# Patient Record
Sex: Female | Born: 1985 | Race: Black or African American | Hispanic: No | Marital: Single | Smoking: Never smoker
Health system: Southern US, Community
[De-identification: ages and names within clinical notes are randomized; demographics above are authoritative.]

---

## 2014-01-22 ENCOUNTER — Encounter (HOSPITAL_COMMUNITY): Payer: Self-pay | Admitting: Emergency Medicine

## 2014-01-22 DIAGNOSIS — K5900 Constipation, unspecified: Secondary | ICD-10-CM | POA: Insufficient documentation

## 2014-01-22 DIAGNOSIS — Z792 Long term (current) use of antibiotics: Secondary | ICD-10-CM | POA: Insufficient documentation

## 2014-01-22 DIAGNOSIS — R109 Unspecified abdominal pain: Secondary | ICD-10-CM | POA: Insufficient documentation

## 2014-01-22 DIAGNOSIS — Z3202 Encounter for pregnancy test, result negative: Secondary | ICD-10-CM | POA: Insufficient documentation

## 2014-01-22 LAB — CBC WITH DIFFERENTIAL/PLATELET
BASOS PCT: 0 % (ref 0–1)
Basophils Absolute: 0 10*3/uL (ref 0.0–0.1)
EOS PCT: 4 % (ref 0–5)
Eosinophils Absolute: 0.1 10*3/uL (ref 0.0–0.7)
HCT: 30.9 % — ABNORMAL LOW (ref 36.0–46.0)
HEMOGLOBIN: 10.1 g/dL — AB (ref 12.0–15.0)
Lymphocytes Relative: 53 % — ABNORMAL HIGH (ref 12–46)
Lymphs Abs: 1.7 10*3/uL (ref 0.7–4.0)
MCH: 28.1 pg (ref 26.0–34.0)
MCHC: 32.7 g/dL (ref 30.0–36.0)
MCV: 86.1 fL (ref 78.0–100.0)
Monocytes Absolute: 0.4 10*3/uL (ref 0.1–1.0)
Monocytes Relative: 13 % — ABNORMAL HIGH (ref 3–12)
NEUTROS PCT: 30 % — AB (ref 43–77)
Neutro Abs: 1 10*3/uL — ABNORMAL LOW (ref 1.7–7.7)
PLATELETS: 227 10*3/uL (ref 150–400)
RBC: 3.59 MIL/uL — AB (ref 3.87–5.11)
RDW: 13.9 % (ref 11.5–15.5)
WBC: 3.2 10*3/uL — ABNORMAL LOW (ref 4.0–10.5)

## 2014-01-22 LAB — COMPREHENSIVE METABOLIC PANEL
ALK PHOS: 61 U/L (ref 39–117)
ALT: 10 U/L (ref 0–35)
AST: 21 U/L (ref 0–37)
Albumin: 3.9 g/dL (ref 3.5–5.2)
BUN: 12 mg/dL (ref 6–23)
CO2: 24 meq/L (ref 19–32)
Calcium: 8.9 mg/dL (ref 8.4–10.5)
Chloride: 98 mEq/L (ref 96–112)
Creatinine, Ser: 0.65 mg/dL (ref 0.50–1.10)
GFR calc non Af Amer: >90 mL/min (ref >90)
GLUCOSE: 81 mg/dL (ref 70–99)
Potassium: 3.8 mEq/L (ref 3.7–5.3)
SODIUM: 137 meq/L (ref 137–147)
TOTAL PROTEIN: 7.4 g/dL (ref 6.0–8.3)
Total Bilirubin: 0.2 mg/dL — ABNORMAL LOW (ref 0.3–1.2)

## 2014-01-22 NOTE — ED Notes (Signed)
Patient unable to provide urine at this time.  Will continue to monitor.

## 2014-01-22 NOTE — ED Notes (Signed)
Pt. reports low back pain radiating to vagina / anal area , denies injury or fall , no urinary discomfort or discharge . Denies fever , occasional chills.

## 2014-01-23 ENCOUNTER — Emergency Department (HOSPITAL_COMMUNITY)
Admission: EM | Admit: 2014-01-23 | Discharge: 2014-01-23 | Disposition: A | Discharge status: Home / Self Care | Payer: No Typology Code available for payment source | Attending: Emergency Medicine | Admitting: Emergency Medicine

## 2014-01-23 ENCOUNTER — Emergency Department (HOSPITAL_COMMUNITY): Payer: No Typology Code available for payment source

## 2014-01-23 DIAGNOSIS — R109 Unspecified abdominal pain: Secondary | ICD-10-CM

## 2014-01-23 DIAGNOSIS — K5900 Constipation, unspecified: Secondary | ICD-10-CM

## 2014-01-23 LAB — WET PREP, GENITAL
Trich, Wet Prep: NONE SEEN
YEAST WET PREP: NONE SEEN

## 2014-01-23 LAB — URINALYSIS, ROUTINE W REFLEX MICROSCOPIC
Bilirubin Urine: NEGATIVE
Glucose, UA: NEGATIVE mg/dL
Hgb urine dipstick: NEGATIVE
Ketones, ur: >80 mg/dL — AB
LEUKOCYTES UA: NEGATIVE
Nitrite: NEGATIVE
PH: 6 (ref 5.0–8.0)
Protein, ur: 30 mg/dL — AB
Specific Gravity, Urine: 1.032 — ABNORMAL HIGH (ref 1.005–1.030)
Urobilinogen, UA: 0.2 mg/dL (ref 0.0–1.0)

## 2014-01-23 LAB — URINE MICROSCOPIC-ADD ON

## 2014-01-23 LAB — POC URINE PREG, ED: PREG TEST UR: NEGATIVE

## 2014-01-23 MED ORDER — DICYCLOMINE HCL 20 MG PO TABS: 20.0000 mg | ORAL_TABLET | Freq: Two times a day (BID) | ORAL | Status: AC

## 2014-01-23 MED ORDER — KETOROLAC TROMETHAMINE 30 MG/ML IJ SOLN
30.0000 mg | Freq: Once | INTRAMUSCULAR | Status: AC
  Administered 2014-01-23: 30 mg via INTRAVENOUS
  Filled 2014-01-23: qty 1

## 2014-01-23 MED ORDER — IBUPROFEN 600 MG PO TABS: 600.0000 mg | ORAL_TABLET | Freq: Four times a day (QID) | ORAL | Status: AC | PRN

## 2014-01-23 MED ORDER — POLYETHYLENE GLYCOL 3350 17 G PO PACK: 17.0000 g | PACK | Freq: Every day | ORAL | Status: AC

## 2014-01-23 MED ORDER — SODIUM CHLORIDE 0.9 % IV BOLUS (SEPSIS)
1000.0000 mL | Freq: Once | INTRAVENOUS | Status: AC
  Administered 2014-01-23: 1000 mL via INTRAVENOUS

## 2014-01-23 NOTE — Discharge Instructions (Signed)
If your symptoms continue call and followup in the OB/GYN clinic. Return immediately for worsening pain, persistent vomiting, fever or for any concerns.  Abdominal Pain, Women Abdominal (stomach, pelvic, or belly) pain can be caused by many things. It is important to tell your doctor:  The location of the pain.  Does it come and go or is it present all the time?  Are there things that start the pain (eating certain foods, exercise)?  Are there other symptoms associated with the pain (fever, nausea, vomiting, diarrhea)? All of this is helpful to know when trying to find the cause of the pain. CAUSES   Stomach: virus or bacteria infection, or ulcer.  Intestine: appendicitis (inflamed appendix), regional ileitis (Crohn's disease), ulcerative colitis (inflamed colon), irritable bowel syndrome, diverticulitis (inflamed diverticulum of the colon), or cancer of the stomach or intestine.  Gallbladder disease or stones in the gallbladder.  Kidney disease, kidney stones, or infection.  Pancreas infection or cancer.  Fibromyalgia (pain disorder).  Diseases of the female organs:  Uterus: fibroid (non-cancerous) tumors or infection.  Fallopian tubes: infection or tubal pregnancy.  Ovary: cysts or tumors.  Pelvic adhesions (scar tissue).  Endometriosis (uterus lining tissue growing in the pelvis and on the pelvic organs).  Pelvic congestion syndrome (female organs filling up with blood just before the menstrual period).  Pain with the menstrual period.  Pain with ovulation (producing an egg).  Pain with an IUD (intrauterine device, birth control) in the uterus.  Cancer of the female organs.  Functional pain (pain not caused by a disease, may improve without treatment).  Psychological pain.  Depression. DIAGNOSIS  Your doctor will decide the seriousness of your pain by doing an examination.  Blood tests.  X-rays.  Ultrasound.  CT scan (computed tomography, special type  of X-ray).  MRI (magnetic resonance imaging).  Cultures, for infection.  Barium enema (dye inserted in the large intestine, to better view it with X-rays).  Colonoscopy (looking in intestine with a lighted tube).  Laparoscopy (minor surgery, looking in abdomen with a lighted tube).  Major abdominal exploratory surgery (looking in abdomen with a large incision). TREATMENT  The treatment will depend on the cause of the pain.   Many cases can be observed and treated at home.  Over-the-counter medicines recommended by your caregiver.  Prescription medicine.  Antibiotics, for infection.  Birth control pills, for painful periods or for ovulation pain.  Hormone treatment, for endometriosis.  Nerve blocking injections.  Physical therapy.  Antidepressants.  Counseling with a psychologist or psychiatrist.  Minor or major surgery. HOME CARE INSTRUCTIONS   Do not take laxatives, unless directed by your caregiver.  Take over-the-counter pain medicine only if ordered by your caregiver. Do not take aspirin because it can cause an upset stomach or bleeding.  Try a clear liquid diet (broth or water) as ordered by your caregiver. Slowly move to a bland diet, as tolerated, if the pain is related to the stomach or intestine.  Have a thermometer and take your temperature several times a day, and record it.  Bed rest and sleep, if it helps the pain.  Avoid sexual intercourse, if it causes pain.  Avoid stressful situations.  Keep your follow-up appointments and tests, as your caregiver orders.  If the pain does not go away with medicine or surgery, you may try:  Acupuncture.  Relaxation exercises (yoga, meditation).  Group therapy.  Counseling. SEEK MEDICAL CARE IF:   You notice certain foods cause stomach pain.  Your home  care treatment is not helping your pain.  You need stronger pain medicine.  You want your IUD removed.  You feel faint or lightheaded.  You  develop nausea and vomiting.  You develop a rash.  You are having side effects or an allergy to your medicine. SEEK IMMEDIATE MEDICAL CARE IF:   Your pain does not go away or gets worse.  You have a fever.  Your pain is felt only in portions of the abdomen. The right side could possibly be appendicitis. The left lower portion of the abdomen could be colitis or diverticulitis.  You are passing blood in your stools (bright red or black tarry stools, with or without vomiting).  You have blood in your urine.  You develop chills, with or without a fever.  You pass out. MAKE SURE YOU:   Understand these instructions.  Will watch your condition.  Will get help right away if you are not doing well or get worse. Document Released: 08/23/2007 Document Revised: 01/18/2012 Document Reviewed: 09/12/2009 [facility] Patient Information 2014 [employer], [place].

## 2014-01-23 NOTE — ED Notes (Signed)
Family at bedside. 

## 2014-01-23 NOTE — ED Provider Notes (Signed)
CSN: [id number]     Arrival date & time 01/22/14  2040 History   First MD Initiated Contact with Patient 01/23/14 [phone]     Chief Complaint  Patient presents with  . Back Pain     (Consider location/radiation/quality/duration/timing/severity/associated sxs/prior Treatment) HPI Patient presents with 2 days of lower bowel pain that is spasming in nature. She was recently started on Cipro for urinary tract infection with no improvement of her symptoms. She denies dysuria, urinary frequency, urgency or hematuria. She's had no fevers or chills. She denies any back or flank pain. She's had no vaginal discharge or bleeding. She states that the day before her symptoms started she had several loose stools but since has not passed stool. Patient states she is not sexually active. History reviewed. No pertinent past medical history. History reviewed. No pertinent past surgical history. No family history on file. History  Substance Use Topics  . Smoking status: Never Smoker   . Smokeless tobacco: Not on file  . Alcohol Use: No   OB History   Grav Para Term Preterm Abortions TAB SAB Ect Mult Living                 Review of Systems  Constitutional: Negative for fever and chills.  Respiratory: Negative for shortness of breath.   Cardiovascular: Negative for chest pain.  Gastrointestinal: Positive for abdominal pain and constipation. Negative for nausea, vomiting and blood in stool.  Genitourinary: Negative for dysuria, hematuria, flank pain, vaginal bleeding and pelvic pain.  Musculoskeletal: Negative for back pain, myalgias, neck pain and neck stiffness.  Skin: Negative for rash and wound.  Neurological: Negative for dizziness, weakness, light-headedness, numbness and headaches.  All other systems reviewed and are negative.      Allergies  Review of patient's allergies indicates no known allergies.  Home Medications   Current Outpatient Rx  Name  Route  Sig  Dispense  Refill  .  ciprofloxacin (CIPRO) 500 MG tablet   Oral   Take 500 mg by mouth 2 (two) times daily. For 10 days, started on 01-22-14 on day 1 of therapy          BP 123/83  Pulse 64  Temp(Src) 98.4 F (36.9 C) (Oral)  Resp 17  Ht 5\' 3"  (1.6 m)  Wt 138 lb (62.596 kg)  BMI 24.45 kg/m2  SpO2 99%  LMP 01/16/2014 Physical Exam  Nursing note and vitals reviewed. Constitutional: She is oriented to person, place, and time. She appears well-developed and well-nourished. No distress.  HENT:  Head: Normocephalic and atraumatic.  Mouth/Throat: Oropharynx is clear and moist.  Eyes: EOM are normal. Pupils are equal, round, and reactive to light.  Neck: Normal range of motion. Neck supple.  Cardiovascular: Normal rate and regular rhythm.   Pulmonary/Chest: Effort normal and breath sounds normal. No respiratory distress. She has no wheezes. She has no rales. She exhibits no tenderness.  Abdominal: Soft. Bowel sounds are normal. She exhibits no distension and no mass. There is tenderness (tenderness to palpation over the suprapubic region. There is no rebound or guarding.). There is no rebound and no guarding.  Genitourinary:  Painful pelvic exam. Hymen still partially intact. Unable to visualize cervix. Mild yellow discharge.  Musculoskeletal: Normal range of motion. She exhibits no edema and no tenderness.  No midline thoracic or lumbar tenderness to palpation. Patient has no CVA tenderness bilaterally.  Neurological: She is alert and oriented to person, place, and time.  Moves all extremities without deficit. Sensation  grossly intact.  Skin: Skin is warm and dry. No rash noted. No erythema.  Psychiatric: She has a normal mood and affect. Her behavior is normal.    ED Course  Procedures (including critical care time) Labs Review Labs Reviewed  URINALYSIS, ROUTINE W REFLEX MICROSCOPIC - Abnormal; Notable for the following:    APPearance CLOUDY (*)    Specific Gravity, Urine 1.032 (*)    Ketones, ur  >80 (*)    Protein, ur 30 (*)    All other components within normal limits  CBC WITH DIFFERENTIAL - Abnormal; Notable for the following:    WBC 3.2 (*)    RBC 3.59 (*)    Hemoglobin 10.1 (*)    HCT 30.9 (*)    Neutrophils Relative % 30 (*)    Lymphocytes Relative 53 (*)    Monocytes Relative 13 (*)    Neutro Abs 1.0 (*)    All other components within normal limits  COMPREHENSIVE METABOLIC PANEL - Abnormal; Notable for the following:    Total Bilirubin 0.2 (*)    All other components within normal limits  URINE MICROSCOPIC-ADD ON - Abnormal; Notable for the following:    Bacteria, UA FEW (*)    All other components within normal limits  GC/CHLAMYDIA PROBE AMP  POC URINE PREG, ED   Imaging Review No results found.   EKG Interpretation None      MDM   Final diagnoses:  None   Patient's abdominal discomfort is now improved. Her abdomen is soft with no tenderness or rebound. Patient is not such that his and there is no obvious evidence of cervicitis though exam is limited. We'll have followup with OB/GYN and not pretreat. No obvious source for the patient's abdominal discomfort found. Question constipation. She is now pain-free. Thorough return precautions given. I don't believe CT imaging is necessary at this point.    Loren Raceravid Madelyne Millikan, MD 01/23/14 [id number]

## 2014-01-24 LAB — GC/CHLAMYDIA PROBE AMP
CT PROBE, AMP APTIMA: NEGATIVE
GC PROBE AMP APTIMA: NEGATIVE

## 2015-09-20 IMAGING — CR DG ABDOMEN 1V
1 series · 1 of 1 positions shown · non-contrast
Comparison: None.

CLINICAL DATA: Right sided abdominal pain and periumbilical pain

EXAM:
ABDOMEN - 1 VIEW

[t abdomen supine]
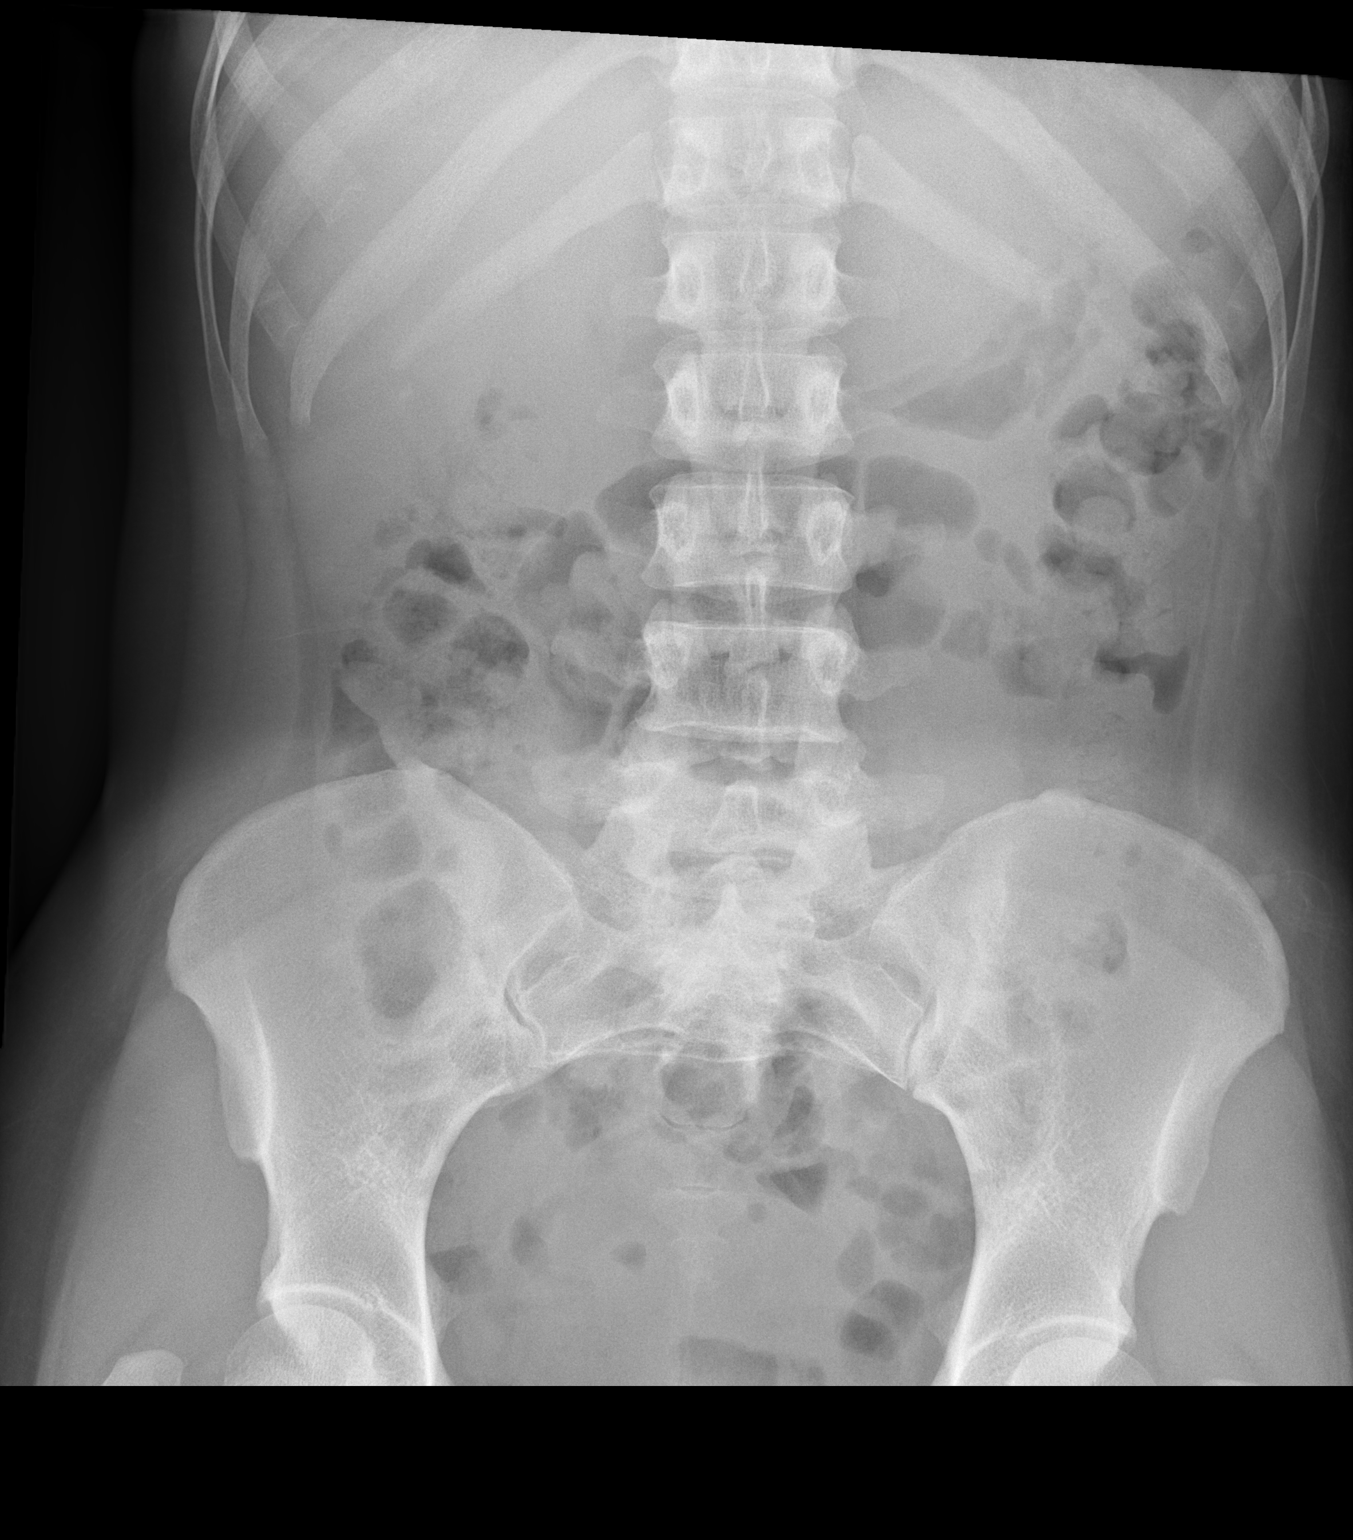

[1 of 1 positions shown; findings below may reference images not displayed]

FINDINGS: The bowel gas pattern is within the limits of normal. There is a
moderate stool burden in the transverse and descending colon. There
is a tiny calcification approximately 6 cm lateral to the right L2
transverse process just inferior to the tip of the right twelfth
rib. This measures 3 mm in diameter and may reflect a stone either
in the right kidney or in the gallbladder. The observed portions of
the lumbar spine and bony pelvis appear normal.
IMPRESSION: 1. An approximately 3 mm calcification just inferior to the tip of
the right twelfth rib may reflect a gallstone or kidney stone.
2. The bowel gas pattern is within the limits of normal.
